# Patient Record
Sex: Female | Born: 2000 | Race: White | Hispanic: No | Marital: Single | Smoking: Never smoker
Health system: Southern US, Community
[De-identification: ages and names within clinical notes are randomized; demographics above are authoritative.]

## PROBLEM LIST (undated history)

## (undated) DIAGNOSIS — J210 Acute bronchiolitis due to respiratory syncytial virus: Secondary | ICD-10-CM

## (undated) DIAGNOSIS — R51 Headache: Secondary | ICD-10-CM

## (undated) DIAGNOSIS — G43909 Migraine, unspecified, not intractable, without status migrainosus: Secondary | ICD-10-CM

## (undated) DIAGNOSIS — L709 Acne, unspecified: Secondary | ICD-10-CM

## (undated) HISTORY — DX: Headache: R51

## (undated) HISTORY — DX: Acute bronchiolitis due to respiratory syncytial virus: J21.0

## (undated) HISTORY — DX: Migraine, unspecified, not intractable, without status migrainosus: G43.909

## (undated) HISTORY — DX: Acne, unspecified: L70.9

---
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
## 2019-02-18 ENCOUNTER — Emergency Department (HOSPITAL_COMMUNITY): Payer: Medicaid Other

## 2019-02-18 ENCOUNTER — Emergency Department (HOSPITAL_COMMUNITY)
Admission: EM | Admit: 2019-02-18 | Discharge: 2019-02-18 | Disposition: A | Discharge status: Home / Self Care | Payer: Medicaid Other | Attending: Emergency Medicine | Admitting: Emergency Medicine

## 2019-02-18 ENCOUNTER — Encounter (HOSPITAL_COMMUNITY): Payer: Self-pay | Admitting: Emergency Medicine

## 2019-02-18 ENCOUNTER — Other Ambulatory Visit: Payer: Self-pay

## 2019-02-18 DIAGNOSIS — R519 Headache, unspecified: Secondary | ICD-10-CM

## 2019-02-18 MED ORDER — IBUPROFEN 200 MG PO TABS
400.0000 mg | ORAL_TABLET | Freq: Once | ORAL | Status: AC
  Administered 2019-02-18: 400 mg via ORAL
  Filled 2019-02-18: qty 2

## 2019-02-18 NOTE — ED Provider Notes (Signed)
[facility] DEPT Provider Note   CSN: [id number] Arrival date & time: 02/18/19  1847     History   Chief Complaint Chief Complaint  Patient presents with  . Headache    HPI Paige Schmidt is a 18 y.o. female.     HPI Patient presents with headache.  Has had around 3 weeks of headaches.  Headaches are dull and sometimes in the front sometimes in the back of her head.  No vision changes.  No nausea or vomiting.  Come and go.  Used to be daily but now about twice a week.  At times will feel sharp pain in the right side of her head.  Not associated with her other headache.  States sometimes her teeth will chatter.  States her right side also feels stiffer than the left side.  States this has been more constant and is not associate with a headache.  No trauma.  States she saw her PCP who is ordered an MRI of his tonsils to be done for a few days she is worried due to the severity of the headache. Past Medical History:  Diagnosis Date  . Acne   . Headache(784.0)    migraines  . Migraines 03/12/2011  . Prematurity    33-34 weeks, 2 weeks NICU, V ent  . Prematurity of fetus   . RSV (acute bronchiolitis due to respiratory syncytial virus)     Patient Active Problem List   Diagnosis Date Noted  . Fever, unspecified 12/20/2015  . High risk sexual behavior 12/20/2015  . GAD (generalized anxiety disorder) 10/25/2014  . Plantar wart of left foot 11/06/2013  . Viral syndrome 05/26/2013  . Acne 03/12/2011    History reviewed. No pertinent surgical history.   OB History   No obstetric history on file.      Home Medications    Prior to Admission medications   Medication Sig Start Date End Date Taking? Authorizing Provider  Ascorbic Acid (VITAMIN C) 250 MG CHEW Chew 1 tablet by mouth daily.   Yes [provider]  busPIRone (BUSPAR) 15 MG tablet Take 15-20 mg by mouth See admin instructions. 15 Mg in AM and 20 MG at night 09/26/18  Yes  [provider]  etonogestrel (NEXPLANON) 68 MG IMPL implant 1 each by Subdermal route once.   Yes [provider]  Multiple Vitamin (MULTIVITAMIN WITH MINERALS) TABS tablet Take 1 tablet by mouth daily.   Yes [provider]  escitalopram (LEXAPRO) 10 MG tablet Take 1.5 tablets (15 mg total) by mouth daily. Patient not taking: Reported on 02/18/2019 08/03/15   Jonathon Resides T, FNP  escitalopram (LEXAPRO) 10 MG tablet Take 1.5 tablets (15 mg total) by mouth daily. Patient not taking: Reported on 02/18/2019 09/28/15   Trude Mcburney, FNP    Family History Family History  Problem Relation Age of Onset  . Mental illness Mother   . Mental illness Maternal Aunt   . Hypertension Maternal Grandmother   . Depression Maternal Grandmother   . Diabetes Maternal Grandmother   . Hypertension Maternal Grandfather   . Asthma Maternal Grandfather   . Stroke Maternal Grandfather     Social History Social History   Tobacco Use  . Smoking status: Never Smoker  . Smokeless tobacco: Never Used  Substance Use Topics  . Alcohol use: No    Alcohol/week: 0.0 standard drinks  . Drug use: No     Allergies   Patient has no known allergies.  Review of Systems Review of Systems  Constitutional: Negative for appetite change.  HENT: Negative for congestion.   Respiratory: Negative for shortness of breath.   Cardiovascular: Negative for chest pain.  Gastrointestinal: Negative for abdominal distention.  Genitourinary: Negative for dysuria.  Musculoskeletal: Negative for back pain.  Skin: Negative for rash.  Neurological: Positive for headaches. Negative for speech difficulty and weakness.  Hematological: Negative for adenopathy.  Psychiatric/Behavioral: Negative for confusion.     Physical Exam Updated Vital Signs BP (!) 113/93   Pulse 83   Temp 99.8 F (37.7 C)   Resp 18   LMP 02/18/2019   SpO2 100%   Physical Exam Vitals signs and nursing note  reviewed.  HENT:     Head: Normocephalic and atraumatic.  Eyes:     Extraocular Movements: Extraocular movements intact.  Cardiovascular:     Rate and Rhythm: Normal rate and regular rhythm.  Pulmonary:     Breath sounds: Normal breath sounds.  Abdominal:     General: Bowel sounds are normal.     Palpations: Abdomen is soft.  Musculoskeletal: Normal range of motion.  Skin:    General: Skin is warm.  Neurological:     Mental Status: She is alert.  Psychiatric:        Mood and Affect: Mood normal.   Good strength and movement bilaterally.   ED Treatments / Results  Labs (all labs ordered are listed, but only abnormal results are displayed) Labs Reviewed - No data to display  EKG None  Radiology Ct Head Wo Contrast  Result Date: 02/18/2019 CLINICAL DATA:  Acute headaches for the past few months EXAM: CT HEAD WITHOUT CONTRAST TECHNIQUE: Contiguous axial images were obtained from the base of the skull through the vertex without intravenous contrast. COMPARISON:  None. FINDINGS: Brain: No evidence of acute infarction, hemorrhage, hydrocephalus, extra-axial collection or mass lesion/mass effect. Vascular: No hyperdense vessel or unexpected calcification. Skull: Normal. Negative for fracture or focal lesion. Sinuses/Orbits: No acute finding. Other: None. IMPRESSION: Normal head CT Electronically Signed   By: Alcide Clever M.D.   On: 02/18/2019 22:19    Procedures Procedures (including critical care time)  Medications Ordered in ED Medications  ibuprofen (ADVIL) tablet 400 mg (has no administration in time range)     Initial Impression / Assessment and Plan / ED Course  I have reviewed the triage vital signs and the nursing notes.  Pertinent labs & imaging results that were available during my care of the patient were reviewed by me and considered in my medical decision making (see chart for details).        Patient with headache.  Nonfocal exam despite complaints of  stiffness on the right.  Head CT done reassuring.  Outpatient follow-up for further evaluation.  Final Clinical Impressions(s) / ED Diagnoses   Final diagnoses:  Acute nonintractable headache, unspecified headache type    ED Discharge Orders    None       Benjiman Core, MD 02/18/19 2302

## 2019-02-18 NOTE — ED Triage Notes (Signed)
Patient requesting CT head. States seen by PCP and one scheduled for Tuesday but started "feeling weird" today. Reports intermittent headache and numbness to right face and arm x3 weeks. Ambulatory.

## 2019-03-02 ENCOUNTER — Other Ambulatory Visit: Payer: Self-pay

## 2019-03-02 DIAGNOSIS — Z20822 Contact with and (suspected) exposure to covid-19: Secondary | ICD-10-CM

## 2019-03-04 LAB — NOVEL CORONAVIRUS, NAA: SARS-CoV-2, NAA: NOT DETECTED

## 2019-03-05 ENCOUNTER — Telehealth: Payer: Self-pay

## 2019-03-05 NOTE — Telephone Encounter (Signed)
Caller given negative result  

## 2019-03-16 ENCOUNTER — Other Ambulatory Visit: Payer: BLUE CROSS/BLUE SHIELD

## 2020-01-11 ENCOUNTER — Telehealth (INDEPENDENT_AMBULATORY_CARE_PROVIDER_SITE_OTHER): Payer: Medicaid Other | Admitting: Psychiatry

## 2020-01-11 ENCOUNTER — Other Ambulatory Visit: Payer: Self-pay

## 2020-01-11 ENCOUNTER — Encounter (HOSPITAL_COMMUNITY): Payer: Self-pay | Admitting: Psychiatry

## 2020-01-11 DIAGNOSIS — F410 Panic disorder [episodic paroxysmal anxiety] without agoraphobia: Secondary | ICD-10-CM | POA: Diagnosis not present

## 2020-01-11 DIAGNOSIS — F411 Generalized anxiety disorder: Secondary | ICD-10-CM

## 2020-01-11 DIAGNOSIS — F3341 Major depressive disorder, recurrent, in partial remission: Secondary | ICD-10-CM

## 2020-01-11 DIAGNOSIS — F3340 Major depressive disorder, recurrent, in remission, unspecified: Secondary | ICD-10-CM | POA: Insufficient documentation

## 2020-01-11 MED ORDER — BUSPIRONE HCL 10 MG PO TABS: ORAL_TABLET | ORAL | 1 refills | Status: DC

## 2020-01-11 MED ORDER — LORAZEPAM 0.5 MG PO TABS: 0.5000 mg | ORAL_TABLET | Freq: Every day | ORAL | 1 refills | Status: DC | PRN

## 2020-01-11 MED ORDER — SERTRALINE HCL 50 MG PO TABS: ORAL_TABLET | ORAL | 1 refills | Status: DC

## 2020-01-11 NOTE — Progress Notes (Signed)
Psychiatric Initial Adult Assessment   Virtual Visit via Video Note  I connected with Paige Schmidt on 01/11/20 at  9:20 AM EDT by a video enabled telemedicine application and verified that I am speaking with the correct person using two identifiers.  Location: Patient: Home Provider: Clinic   I discussed the limitations of evaluation and management by telemedicine and the availability of in person appointments. The patient expressed understanding and agreed to proceed.  I provided 35 minutes of non-face-to-face time during this encounter.     Patient Identification: Paige Schmidt MRN:  [id number] Date of Evaluation:  01/11/2020   Referral Source: [facility]  Chief Complaint:   " I want to switch my psychiatrist as I have a hard time getting prescription refills from them."  Visit Diagnosis:    ICD-10-CM   1. 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 MDD (major depressive disorder), recurrent, in partial remission (HCC)  F33.41     History of Present Illness: This is a 19 year old female with history of MDD and generalized anxiety disorder with panic attacks now seen for evaluation and establishing care. Patient reported that she has been seeing a psychiatrist in [place] however is unable to get hold of them whenever she needs refills or has any issues. She informed that she has been dealing with some depression anxiety symptoms for the past few years. She was prescribed Prozac couple of years ago but she did not tolerate it well. She was also prescribed hydroxyzine but she felt it made her feel weird and she did not like to take it. She was then started on Lexapro and has been on it for about a year. She is taking 10 mg however she did not tolerate it so she dropped the dose back to 5 mg and has been taking that regularly. She also takes buspirone 15 mg in the morning and 20 mg in the evening and wants to keep it that way. She informed  that she was prescribed propanolol as needed for anxiety but is not sure if that really helped. She mentioned that she was once prescribed one-time dose of Ativan and that helped her significantly.  Regarding her depression symptoms, she reported that she does not have any anhedonia or depressed mood anymore. She reported optimal energy levels. She does worry a lot about trivial issues. She stated that she over thinks about minor issues and has a hard time relaxing. She informed that she has a panic attack maybe about once a week now. In the past that used to be a few times a week. She denied any hallucinations. She denied any symptoms suggestive of psychosis or mania. She denied any symptoms suggestive of PTSD.  She denies any consumption of alcohol and also denied any illicit use of drugs.  She works as a Geophysicist/field seismologist and also studies Therapist, sports at [employer]. She resides with her parents. Her family history significant for anxiety and depression in both parents who are taking medications.  Past Psychiatric History: MDD, Anxiety, Panic attacks  Previous Psychotropic Medications: Yes  -Prozac, Hydroxyzine  Substance Abuse History in the last 12 months:  No.  Consequences of Substance Abuse: NA  Past Medical History:  Past Medical History:  Diagnosis Date  . Acne   . Headache(784.0)    migraines  . Migraines 03/12/2011  . Prematurity    33-34 weeks, 2 weeks NICU, V ent  . Prematurity of fetus   .  RSV (acute bronchiolitis due to respiratory syncytial virus)    No past surgical history on file.  Family Psychiatric History: Mom- depression, anxiety- takes celexa. Father- - anxiety, depression, takes Lexapro  Family History:  Family History  Problem Relation Age of Onset  . Mental illness Mother   . Mental illness Maternal Aunt   . Hypertension Maternal Grandmother   . Depression Maternal Grandmother   . Diabetes Maternal Grandmother   . Hypertension Maternal Grandfather   . Asthma  Maternal Grandfather   . Stroke Maternal Grandfather     Social History:   Social History   Socioeconomic History  . Marital status: Single    Spouse name: Not on file  . Number of children: Not on file  . Years of education: Not on file  . Highest education level: Not on file  Occupational History  . Not on file  Tobacco Use  . Smoking status: Never Smoker  . Smokeless tobacco: Never Used  Substance and Sexual Activity  . Alcohol use: No    Alcohol/week: 0.0 standard drinks  . Drug use: No  . Sexual activity: Never  Other Topics Concern  . Not on file  Social History Narrative  . Not on file   Social Determinants of Health   Financial Resource Strain:   . Difficulty of Paying Living Expenses: Not on file  Food Insecurity:   . Worried About Programme researcher, broadcasting/film/video in the Last Year: Not on file  . Ran Out of Food in the Last Year: Not on file  Transportation Needs:   . Lack of Transportation (Medical): Not on file  . Lack of Transportation (Non-Medical): Not on file  Physical Activity:   . Days of Exercise per Week: Not on file  . Minutes of Exercise per Session: Not on file  Stress:   . Feeling of Stress : Not on file  Social Connections:   . Frequency of Communication with Friends and Family: Not on file  . Frequency of Social Gatherings with Friends and Family: Not on file  . Attends Religious Services: Not on file  . Active Member of Clubs or Organizations: Not on file  . Attends Banker Meetings: Not on file  . 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 Also studying at [employer] via online classes- working on [employer] in [employer]  Allergies:  No Known Allergies  Metabolic Disorder Labs: No results found for: HGBA1C, MPG No results found for: PROLACTIN No results found for: CHOL, TRIG, HDL, CHOLHDL, VLDL, LDLCALC Lab Results  Component Value Date   TSH 2.135 12/08/2014     Therapeutic Level Labs: No results found for: LITHIUM No results found for: CBMZ No results found for: VALPROATE  Current Medications: Current Outpatient Medications  Medication Sig Dispense Refill  . Ascorbic Acid (VITAMIN C) 250 MG CHEW Chew 1 tablet by mouth daily.    . busPIRone (BUSPAR) 15 MG tablet Take 15-20 mg by mouth See admin instructions. 15 Mg in AM and 20 MG at night    . escitalopram (LEXAPRO) 10 MG tablet Take 1.5 tablets (15 mg total) by mouth daily. (Patient not taking: Reported on 02/18/2019) 45 tablet 1  . escitalopram (LEXAPRO) 10 MG tablet Take 1.5 tablets (15 mg total) by mouth daily. (Patient not taking: Reported on 02/18/2019) 45 tablet 3  . etonogestrel (NEXPLANON) 68 MG IMPL implant 1 each by Subdermal route  once.    . Multiple Vitamin (MULTIVITAMIN WITH MINERALS) TABS tablet Take 1 tablet by mouth daily.     No current facility-administered medications for this visit.     Psychiatric Specialty Exam: Review of Systems  There were no vitals taken for this visit.There is no height or weight on file to calculate BMI.  General Appearance: Fairly Groomed  Eye Contact:  Good  Speech:  Clear and Coherent and Normal Rate  Volume:  Normal  Mood:  Anxious  Affect:  Congruent  Thought Process:  Goal Directed  Orientation:  Full (Time, Place, and Person)  Thought Content:  Logical  Suicidal Thoughts:  No  Homicidal Thoughts:  No  Memory:  Immediate;   Good Recent;   Good  Judgement:  Fair  Insight:  Fair  Psychomotor Activity:  Normal  Concentration:  Concentration: Good and Attention Span: Good  Recall:  Good  Fund of Knowledge:Good  Language: Good  Akathisia:  No  Handed:  Right  AIMS (if indicated):  Not done  Assets:  Communication Skills Desire for Improvement Financial Resources/Insurance Housing  ADL's:  Intact  Cognition: WNL  Sleep:  Fair     Assessment and Plan: Patient is reporting suboptimal management of anxiety and panic  attacks with low-dose Lexapro. She does not want to go up on the dose of Lexapro as she did not tolerate it well in the past. She was prescribed as needed Ativan for breakthrough panic attacks. She wants to continue the buspirone at the current dose. Patient was offered sertraline to help with her depression anxiety symptoms. Patient was agreeable to try it. Potential side effects of medication and risks vs benefits of treatment vs non-treatment were explained and discussed. All questions were answered.   1. MDD (major depressive disorder), recurrent, in partial remission (HCC)  -Start sertraline (ZOLOFT) 50 MG tablet; Take half tablet in the morning with breakfast for 1 week then take whole tablet in the morning with breakfast  Dispense: 30 tablet; Refill: 1 -Discontinue Lexapro.  2. Generalized anxiety disorder with panic attacks  - sertraline (ZOLOFT) 50 MG tablet; Take half tablet in the morning with breakfast for 1 week then take whole tablet in the morning with breakfast  Dispense: 30 tablet; Refill: 1 -Continue busPIRone (BUSPAR) 10 MG tablet; Take one and a half tabs in the morning and 2 tabs in the evening  Dispense: 120 tablet; Refill: 1 -Start LORazepam (ATIVAN) 0.5 MG tablet; Take 1 tablet (0.5 mg total) by mouth daily as needed for anxiety.  Dispense: 15 tablet; Refill: 1  Follow-up in 6 weeks.  Zena Amos, MD 10/18/20219:42 AM

## 2020-02-02 ENCOUNTER — Other Ambulatory Visit (HOSPITAL_COMMUNITY): Payer: Self-pay | Admitting: Psychiatry

## 2020-02-02 DIAGNOSIS — F3341 Major depressive disorder, recurrent, in partial remission: Secondary | ICD-10-CM

## 2020-02-02 DIAGNOSIS — F410 Panic disorder [episodic paroxysmal anxiety] without agoraphobia: Secondary | ICD-10-CM

## 2020-02-26 ENCOUNTER — Other Ambulatory Visit: Payer: Self-pay

## 2020-02-26 ENCOUNTER — Encounter (HOSPITAL_COMMUNITY): Payer: Self-pay | Admitting: Psychiatry

## 2020-02-26 ENCOUNTER — Telehealth (INDEPENDENT_AMBULATORY_CARE_PROVIDER_SITE_OTHER): Payer: Medicaid Other | Admitting: Psychiatry

## 2020-02-26 DIAGNOSIS — F3341 Major depressive disorder, recurrent, in partial remission: Secondary | ICD-10-CM

## 2020-02-26 DIAGNOSIS — F411 Generalized anxiety disorder: Secondary | ICD-10-CM

## 2020-02-26 DIAGNOSIS — F3340 Major depressive disorder, recurrent, in remission, unspecified: Secondary | ICD-10-CM | POA: Diagnosis not present

## 2020-02-26 DIAGNOSIS — F410 Panic disorder [episodic paroxysmal anxiety] without agoraphobia: Secondary | ICD-10-CM | POA: Diagnosis not present

## 2020-02-26 MED ORDER — SERTRALINE HCL 50 MG PO TABS: 50.0000 mg | ORAL_TABLET | Freq: Every day | ORAL | 1 refills | Status: DC

## 2020-02-26 MED ORDER — BUSPIRONE HCL 10 MG PO TABS: ORAL_TABLET | ORAL | 1 refills | Status: DC

## 2020-02-26 NOTE — Progress Notes (Signed)
BH OP Progress Note   Virtual Visit via Telephone Note  I connected with Paige Schmidt on 02/26/20 at 11:20 AM EST by telephone and verified that I am speaking with the correct person using two identifiers.  Location: Patient: home Provider: Clinic   I discussed the limitations, risks, security and privacy concerns of performing an evaluation and management service by telephone and the availability of in person appointments. I also discussed with the patient that there may be a patient responsible charge related to this service. The patient expressed understanding and agreed to proceed.   I provided 14 minutes of non-face-to-face time during this encounter.    Patient Identification: Paige Schmidt MRN:  [id number] Date of Evaluation:  02/26/2020   Chief Complaint: " I am feeling good."    Visit Diagnosis:    ICD-10-CM   1. MDD (recurrent major depressive disorder) in remission (HCC)  F33.40   2. Generalized anxiety disorder with panic attacks  F41.1    F41.0     History of Present Illness: Patient reported that she feels Zoloft has helped her and she feels like she is back on the track.  She stated that her mood and anxiety are well controlled.  She feels she is doing well on the current regimen and would like to keep things the way they are.  She reported that she has not needed to take Ativan even once since she started the Zoloft.   She denies any other concerns or issues at this time.   Past Psychiatric History: MDD, Anxiety, Panic attacks  Previous Psychotropic Medications: Yes  -Prozac, Lexapro, Hydroxyzine  Substance Abuse History in the last 12 months:  No.  Consequences of Substance Abuse: NA  Past Medical History:  Past Medical History:  Diagnosis Date  . Acne   . Headache(784.0)    migraines  . Migraines 03/12/2011  . Prematurity    33-34 weeks, 2 weeks NICU, V ent  . Prematurity of fetus   . RSV (acute bronchiolitis due to respiratory syncytial  virus)    No past surgical history on file.  Family Psychiatric History: Mom- depression, anxiety- takes celexa. Father- - anxiety, depression, takes Lexapro  Family History:  Family History  Problem Relation Age of Onset  . Mental illness Mother   . Mental illness Maternal Aunt   . Hypertension Maternal Grandmother   . Depression Maternal Grandmother   . Diabetes Maternal Grandmother   . Hypertension Maternal Grandfather   . Asthma Maternal Grandfather   . Stroke Maternal Grandfather     Social History:   Social History   Socioeconomic History  . Marital status: Single    Spouse name: Not on file  . Number of children: Not on file  . Years of education: Not on file  . Highest education level: Not on file  Occupational History  . Not on file  Tobacco Use  . Smoking status: Never Smoker  . Smokeless tobacco: Never Used  Substance and Sexual Activity  . Alcohol use: No    Alcohol/week: 0.0 standard drinks  . Drug use: No  . Sexual activity: Never  Other Topics Concern  . Not on file  Social History Narrative  . Not on file   Social Determinants of Health   Financial Resource Strain:   . Difficulty of Paying Living Expenses: Not on file  Food Insecurity:   . Worried About Programme researcher, broadcasting/film/video in the Last Year: Not on file  . Ran Out  of Food in the Last Year: Not on file  Transportation Needs:   . Lack of Transportation (Medical): Not on file  . Lack of Transportation (Non-Medical): Not on file  Physical Activity:   . Days of Exercise per Week: Not on file  . Minutes of Exercise per Session: Not on file  Stress:   . Feeling of Stress : Not on file  Social Connections:   . Frequency of Communication with Friends and Family: Not on file  . Frequency of Social Gatherings with Friends and Family: Not on file  . Attends Religious Services: Not on file  . Active Member of Clubs or Organizations: Not on file  . Attends Banker Meetings: Not on file  .  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 Also studying at [employer] via online classes- working on [employer] in [employer]  Allergies:  No Known Allergies  Metabolic Disorder Labs: No results found for: HGBA1C, MPG No results found for: PROLACTIN No results found for: CHOL, TRIG, HDL, CHOLHDL, VLDL, LDLCALC Lab Results  Component Value Date   TSH 2.135 12/08/2014    Therapeutic Level Labs: No results found for: LITHIUM No results found for: CBMZ No results found for: VALPROATE  Current Medications: Current Outpatient Medications  Medication Sig Dispense Refill  . Ascorbic Acid (VITAMIN C) 250 MG CHEW Chew 1 tablet by mouth daily.    . busPIRone (BUSPAR) 10 MG tablet Take one and a half tabs in the morning and 2 tabs in the evening 120 tablet 1  . etonogestrel (NEXPLANON) 68 MG IMPL implant 1 each by Subdermal route once.    Marland Kitchen LORazepam (ATIVAN) 0.5 MG tablet Take 1 tablet (0.5 mg total) by mouth daily as needed for anxiety. 15 tablet 1  . Multiple Vitamin (MULTIVITAMIN WITH MINERALS) TABS tablet Take 1 tablet by mouth daily.    . sertraline (ZOLOFT) 50 MG tablet Take half tablet in the morning with breakfast for 1 week then take whole tablet in the morning with breakfast 90 tablet 1   No current facility-administered medications for this visit.     Psychiatric Specialty Exam: Review of Systems  There were no vitals taken for this visit.There is no height or weight on file to calculate BMI.  General Appearance: unable to assess due to phone visit  Eye Contact:  unable to assess due to phone visit  Speech:  Clear and Coherent and Normal Rate  Volume:  Normal  Mood:  Euthymic  Affect:  Congruent  Thought Process:  Goal Directed  Orientation:  Full (Time, Place, and Person)  Thought Content:  Logical  Suicidal Thoughts:  No  Homicidal Thoughts:  No  Memory:  Immediate;   Good Recent;   Good   Judgement:  Fair  Insight:  Fair  Psychomotor Activity:  Normal  Concentration:  Concentration: Good and Attention Span: Good  Recall:  Good  Fund of Knowledge:Good  Language: Good  Akathisia:  No  Handed:  Right  AIMS (if indicated):  Not done  Assets:  Communication Skills Desire for Improvement Financial Resources/Insurance Housing  ADL's:  Intact  Cognition: WNL  Sleep:  Good     Assessment and Plan: Patient seems to be doing well on current regimen.  We will continue the same regimen for now.  1. MDD (recurrent major depressive disorder) in remission (HCC)  - busPIRone (BUSPAR) 10 MG tablet; Take one  and a half tabs in the morning and 2 tabs in the evening  Dispense: 120 tablet; Refill: 1 - sertraline (ZOLOFT) 50 MG tablet; Take 1 tablet (50 mg total) by mouth daily.  Dispense: 90 tablet; Refill: 1  2. Generalized anxiety disorder with panic attacks  - busPIRone (BUSPAR) 10 MG tablet; Take one and a half tabs in the morning and 2 tabs in the evening  Dispense: 120 tablet; Refill: 1 - sertraline (ZOLOFT) 50 MG tablet; Take 1 tablet (50 mg total) by mouth daily.  Dispense: 90 tablet; Refill: 1  Follow-up in 3 months.  Zena Amos, MD 12/3/202111:36 AM

## 2020-05-20 ENCOUNTER — Encounter (HOSPITAL_COMMUNITY): Payer: Self-pay | Admitting: Psychiatry

## 2020-05-20 ENCOUNTER — Other Ambulatory Visit: Payer: Self-pay

## 2020-05-20 ENCOUNTER — Telehealth (INDEPENDENT_AMBULATORY_CARE_PROVIDER_SITE_OTHER): Payer: Medicaid Other | Admitting: Psychiatry

## 2020-05-20 DIAGNOSIS — F410 Panic disorder [episodic paroxysmal anxiety] without agoraphobia: Secondary | ICD-10-CM

## 2020-05-20 DIAGNOSIS — F3340 Major depressive disorder, recurrent, in remission, unspecified: Secondary | ICD-10-CM | POA: Diagnosis not present

## 2020-05-20 DIAGNOSIS — F411 Generalized anxiety disorder: Secondary | ICD-10-CM

## 2020-05-20 MED ORDER — LORAZEPAM 0.5 MG PO TABS: 0.5000 mg | ORAL_TABLET | Freq: Every day | ORAL | 1 refills | Status: DC | PRN

## 2020-05-20 MED ORDER — SERTRALINE HCL 50 MG PO TABS: 50.0000 mg | ORAL_TABLET | Freq: Every day | ORAL | 1 refills | Status: DC

## 2020-05-20 MED ORDER — BUSPIRONE HCL 10 MG PO TABS: ORAL_TABLET | ORAL | 2 refills | Status: DC

## 2020-05-20 NOTE — Progress Notes (Signed)
BH OP Progress Note   Virtual Visit via Video Note  I connected with Paige Schmidt on 05/20/20 at 11:20 AM EST by a video enabled telemedicine application and verified that I am speaking with the correct person using two identifiers.  Location: Patient: Car Provider: Clinic   I discussed the limitations of evaluation and management by telemedicine and the availability of in person appointments. The patient expressed understanding and agreed to proceed.  I provided 16 minutes of non-face-to-face time during this encounter.      Patient Identification: Paige Schmidt MRN:  [id number] Date of Evaluation:  05/20/2020   Chief Complaint: " Things are going well."   Visit Diagnosis:    ICD-10-CM   1. MDD (recurrent major depressive disorder) in remission (HCC)  F33.40   2. Generalized anxiety disorder with panic attacks  F41.1    F41.0     History of Present Illness: Patient follows she has been doing well.  She stated that her mood has been stable.  She is sleeping well at night.  She denied having any panic attacks recently.  She informed that she has had some anxiety which has mostly been situational. She stated that overall things are manageable and she would like to continue the same regimen.   She did ask for refills on lorazepam for as needed use.  She stated that she just wants to have refills on file just in case she needs them again. She denied any other issues or concerns today.   Past Psychiatric History: MDD, Anxiety, Panic attacks  Previous Psychotropic Medications: Yes  -Prozac, Lexapro, Hydroxyzine  Substance Abuse History in the last 12 months:  No.  Consequences of Substance Abuse: NA  Past Medical History:  Past Medical History:  Diagnosis Date  . Acne   . Headache(784.0)    migraines  . Migraines 03/12/2011  . Prematurity    33-34 weeks, 2 weeks NICU, V ent  . Prematurity of fetus   . RSV (acute bronchiolitis due to respiratory syncytial virus)     No past surgical history on file.  Family Psychiatric History: Mom- depression, anxiety- takes celexa. Father- - anxiety, depression, takes Lexapro  Family History:  Family History  Problem Relation Age of Onset  . Mental illness Mother   . Mental illness Maternal Aunt   . Hypertension Maternal Grandmother   . Depression Maternal Grandmother   . Diabetes Maternal Grandmother   . Hypertension Maternal Grandfather   . Asthma Maternal Grandfather   . Stroke Maternal Grandfather     Social History:   Social History   Socioeconomic History  . Marital status: Single    Spouse name: Not on file  . Number of children: Not on file  . Years of education: Not on file  . Highest education level: Not on file  Occupational History  . Not on file  Tobacco Use  . Smoking status: Never Smoker  . Smokeless tobacco: Never Used  Substance and Sexual Activity  . Alcohol use: No    Alcohol/week: 0.0 standard drinks  . Drug use: No  . Sexual activity: Never  Other Topics Concern  . Not on file  Social History Narrative  . 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 Also studying at [employer] via online classes- working on [employer] in [employer]  Allergies:  No Known Allergies  Metabolic Disorder Labs: No results found for: HGBA1C, MPG No results found for: PROLACTIN No results found for: CHOL, TRIG, HDL, CHOLHDL, VLDL, LDLCALC Lab Results  Component Value Date   TSH 2.135 12/08/2014    Therapeutic Level Labs: No results found for: LITHIUM No results found for: CBMZ No results found for: VALPROATE  Current Medications: Current Outpatient Medications  Medication Sig Dispense Refill  . Ascorbic  Acid (VITAMIN C) 250 MG CHEW Chew 1 tablet by mouth daily.    . busPIRone (BUSPAR) 10 MG tablet Take one and a half tabs in the morning and 2 tabs in the evening 120 tablet 1  . etonogestrel (NEXPLANON) 68 MG IMPL implant 1 each by Subdermal route once.    Marland Kitchen LORazepam (ATIVAN) 0.5 MG tablet Take 1 tablet (0.5 mg total) by mouth daily as needed for anxiety. 15 tablet 1  . Multiple Vitamin (MULTIVITAMIN WITH MINERALS) TABS tablet Take 1 tablet by mouth daily.    . sertraline (ZOLOFT) 50 MG tablet Take 1 tablet (50 mg total) by mouth daily. 90 tablet 1   No current facility-administered medications for this visit.     Psychiatric Specialty Exam: Review of Systems  There were no vitals taken for this visit.There is no height or weight on file to calculate BMI.  General Appearance: Fairly groomed  Eye Contact: good  Speech:  Clear and Coherent and Normal Rate  Volume:  Normal  Mood:  Euthymic  Affect:  Congruent  Thought Process:  Goal Directed  Orientation:  Full (Time, Place, and Person)  Thought Content:  Logical  Suicidal Thoughts:  No  Homicidal Thoughts:  No  Memory:  Immediate;   Good Recent;   Good  Judgement:  Fair  Insight:  Fair  Psychomotor Activity:  Normal  Concentration:  Concentration: Good and Attention Span: Good  Recall:  Good  Fund of Knowledge:Good  Language: Good  Akathisia:  No  Handed:  Right  AIMS (if indicated):  Not done  Assets:  Communication Skills Desire for Improvement Financial Resources/Insurance Housing  ADL's:  Intact  Cognition: WNL  Sleep:  Good     Assessment and Plan: Patient seems to be stable on her current regimen.  1. MDD (recurrent major depressive disorder) in remission (HCC)  - busPIRone (BUSPAR) 10 MG tablet; Take one and a half tabs in the morning and 2 tabs in the evening  Dispense: 120 tablet; Refill: 2 - sertraline (ZOLOFT) 50 MG tablet; Take 1 tablet (50 mg total) by mouth daily.  Dispense: 90 tablet; Refill: 1  2.  Generalized anxiety disorder with panic attacks  - LORazepam (ATIVAN) 0.5 MG tablet; Take 1 tablet (0.5 mg total) by mouth daily as needed for anxiety.  Dispense: 15 tablet; Refill: 1 - busPIRone (BUSPAR) 10 MG tablet; Take one and a half tabs in the morning and 2 tabs in the evening  Dispense: 120 tablet; Refill: 2 - sertraline (ZOLOFT) 50 MG tablet; Take 1 tablet (50 mg total) by mouth daily.  Dispense: 90 tablet; Refill: 1  Continue same medication regimen. Follow up in 3 months.    Zena Amos, MD 2/25/202211:09 AM

## 2020-07-25 ENCOUNTER — Other Ambulatory Visit (HOSPITAL_COMMUNITY): Payer: Self-pay | Admitting: Psychiatry

## 2020-07-25 DIAGNOSIS — F410 Panic disorder [episodic paroxysmal anxiety] without agoraphobia: Secondary | ICD-10-CM

## 2020-07-25 DIAGNOSIS — F3340 Major depressive disorder, recurrent, in remission, unspecified: Secondary | ICD-10-CM

## 2020-07-25 DIAGNOSIS — F411 Generalized anxiety disorder: Secondary | ICD-10-CM

## 2020-08-16 ENCOUNTER — Other Ambulatory Visit: Payer: Self-pay

## 2020-08-16 ENCOUNTER — Encounter (HOSPITAL_COMMUNITY): Payer: Self-pay | Admitting: Psychiatry

## 2020-08-16 ENCOUNTER — Telehealth (INDEPENDENT_AMBULATORY_CARE_PROVIDER_SITE_OTHER): Payer: Medicaid Other | Admitting: Psychiatry

## 2020-08-16 DIAGNOSIS — F3340 Major depressive disorder, recurrent, in remission, unspecified: Secondary | ICD-10-CM

## 2020-08-16 DIAGNOSIS — F410 Panic disorder [episodic paroxysmal anxiety] without agoraphobia: Secondary | ICD-10-CM | POA: Diagnosis not present

## 2020-08-16 DIAGNOSIS — F411 Generalized anxiety disorder: Secondary | ICD-10-CM

## 2020-08-16 MED ORDER — LORAZEPAM 0.5 MG PO TABS: 0.5000 mg | ORAL_TABLET | Freq: Every day | ORAL | 1 refills | Status: DC | PRN

## 2020-08-16 MED ORDER — BUSPIRONE HCL 10 MG PO TABS: ORAL_TABLET | ORAL | 1 refills | Status: DC

## 2020-08-16 MED ORDER — SERTRALINE HCL 50 MG PO TABS: 50.0000 mg | ORAL_TABLET | Freq: Every day | ORAL | 0 refills | Status: DC

## 2020-08-16 NOTE — Progress Notes (Signed)
BH OP Progress Note   Virtual Visit via Video Note  I connected with Paige Schmidt on 08/16/20 at 11:00 AM EDT by a video enabled telemedicine application and verified that I am speaking with the correct person using two identifiers.  Location: Patient: Home Provider: Clinic   I discussed the limitations of evaluation and management by telemedicine and the availability of in person appointments. The patient expressed understanding and agreed to proceed.  I provided 14 minutes of non-face-to-face time during this encounter.        Patient Identification: Paige Schmidt MRN:  [id number] Date of Evaluation:  08/16/2020   Chief Complaint: " I am doing good."   Visit Diagnosis:    ICD-10-CM   1. MDD (recurrent major depressive disorder) in remission (HCC)  F33.40 busPIRone (BUSPAR) 10 MG tablet    sertraline (ZOLOFT) 50 MG tablet  2. Generalized anxiety disorder with panic attacks  F41.1 busPIRone (BUSPAR) 10 MG tablet   F41.0 LORazepam (ATIVAN) 0.5 MG tablet    sertraline (ZOLOFT) 50 MG tablet    History of Present Illness: Patient reported she is doing well.  She informed that her mood has been stable.  She has had a few panic attacks here and there but they are not as frequent and as intense as they were in the past.  She stated that she has had to take Ativan maybe 3-4 times in the past month. She has been able to sleep well at night. She is still working as a Social worker in the afternoons.  She denied any other concerns or issues today. She informed that she is going to [place] for vacation in July.  Past Psychiatric History: MDD, Anxiety, Panic attacks  Previous Psychotropic Medications: Yes  -Prozac, Lexapro, Hydroxyzine  Substance Abuse History in the last 12 months:  No.  Consequences of Substance Abuse: NA  Past Medical History:  Past Medical History:  Diagnosis Date  . Acne   . Headache(784.0)    migraines  . Migraines 03/12/2011  . Prematurity    33-34  weeks, 2 weeks NICU, V ent  . Prematurity of fetus   . RSV (acute bronchiolitis due to respiratory syncytial virus)    History reviewed. No pertinent surgical history.  Family Psychiatric History: Mom- depression, anxiety- takes celexa. Father- - anxiety, depression, takes Lexapro  Family History:  Family History  Problem Relation Age of Onset  . Mental illness Mother   . Mental illness Maternal Aunt   . Hypertension Maternal Grandmother   . Depression Maternal Grandmother   . Diabetes Maternal Grandmother   . Hypertension Maternal Grandfather   . Asthma Maternal Grandfather   . Stroke Maternal Grandfather     Social History:   Social History   Socioeconomic History  . Marital status: Single    Spouse name: Not on file  . Number of children: Not on file  . Years of education: Not on file  . Highest education level: Not on file  Occupational History  . Not on file  Tobacco Use  . Smoking status: Never Smoker  . Smokeless tobacco: Never Used  Substance and Sexual Activity  . Alcohol use: No    Alcohol/week: 0.0 standard drinks  . Drug use: No  . Sexual activity: Never  Other Topics Concern  . Not on file  Social History Narrative  . 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 Also studying at [employer] via online classes- working on [employer] in [employer]  Allergies:  No Known Allergies  Metabolic Disorder Labs: No results found for: HGBA1C, MPG No results found for: PROLACTIN No results found for: CHOL, TRIG, HDL, CHOLHDL, VLDL, LDLCALC Lab Results  Component Value Date   TSH 2.135 12/08/2014    Therapeutic Level Labs: No results found for: LITHIUM No results found for: CBMZ No  results found for: VALPROATE  Current Medications: Current Outpatient Medications  Medication Sig Dispense Refill  . Ascorbic Acid (VITAMIN C) 250 MG CHEW Chew 1 tablet by mouth daily.    . busPIRone (BUSPAR) 10 MG tablet Take 1 and a half tablets in the morning and take 2 tablets in the evening 360 tablet 1  . etonogestrel (NEXPLANON) 68 MG IMPL implant 1 each by Subdermal route once.    Marland Kitchen LORazepam (ATIVAN) 0.5 MG tablet Take 1 tablet (0.5 mg total) by mouth daily as needed for anxiety. 15 tablet 1  . Multiple Vitamin (MULTIVITAMIN WITH MINERALS) TABS tablet Take 1 tablet by mouth daily.    . sertraline (ZOLOFT) 50 MG tablet Take 1 tablet (50 mg total) by mouth daily. 90 tablet 0   No current facility-administered medications for this visit.     Psychiatric Specialty Exam: Review of Systems  There were no vitals taken for this visit.There is no height or weight on file to calculate BMI.  General Appearance: Fairly groomed  Eye Contact: good  Speech:  Clear and Coherent and Normal Rate  Volume:  Normal  Mood:  Euthymic  Affect:  Congruent  Thought Process:  Goal Directed  Orientation:  Full (Time, Place, and Person)  Thought Content:  Logical  Suicidal Thoughts:  No  Homicidal Thoughts:  No  Memory:  Immediate;   Good Recent;   Good  Judgement:  Fair  Insight:  Fair  Psychomotor Activity:  Normal  Concentration:  Concentration: Good and Attention Span: Good  Recall:  Good  Fund of Knowledge:Good  Language: Good  Akathisia:  No  Handed:  Right  AIMS (if indicated):  Not done  Assets:  Communication Skills Desire for Improvement Financial Resources/Insurance Housing  ADL's:  Intact  Cognition: WNL  Sleep:  Good     Assessment and Plan: Patient appears to be doing well on her regimen.  1. MDD (recurrent major depressive disorder) in remission (HCC)  - busPIRone (BUSPAR) 10 MG tablet; Take one and a half tabs in the morning and 2 tabs in the evening  Dispense: 120  tablet; Refill: 2 - sertraline (ZOLOFT) 50 MG tablet; Take 1 tablet (50 mg total) by mouth daily.  Dispense: 90 tablet; Refill: 1  2. Generalized anxiety disorder with panic attacks  - LORazepam (ATIVAN) 0.5 MG tablet; Take 1 tablet (0.5 mg total) by mouth daily as needed for anxiety.  Dispense: 15 tablet; Refill: 1 - busPIRone (BUSPAR) 10 MG tablet; Take one and a half tabs in the morning and 2 tabs in the evening  Dispense: 120 tablet; Refill: 2 - sertraline (ZOLOFT) 50 MG tablet; Take 1 tablet (50 mg total) by mouth daily.  Dispense: 90 tablet; Refill: 1  Continue same medication regimen. Follow up in 3 months.  Patient was informed that since writer is leaving  this office she will be seen by different provider at the time of her next visit.  Zena Amos, MD 5/24/202211:13 AM

## 2020-11-10 ENCOUNTER — Telehealth (INDEPENDENT_AMBULATORY_CARE_PROVIDER_SITE_OTHER): Payer: Medicaid Other | Admitting: Physician Assistant

## 2020-11-10 DIAGNOSIS — F411 Generalized anxiety disorder: Secondary | ICD-10-CM

## 2020-11-10 DIAGNOSIS — F3340 Major depressive disorder, recurrent, in remission, unspecified: Secondary | ICD-10-CM

## 2020-11-10 DIAGNOSIS — F410 Panic disorder [episodic paroxysmal anxiety] without agoraphobia: Secondary | ICD-10-CM

## 2020-11-10 MED ORDER — BUSPIRONE HCL 10 MG PO TABS: ORAL_TABLET | ORAL | 1 refills | Status: DC

## 2020-11-10 MED ORDER — SERTRALINE HCL 50 MG PO TABS
50.0000 mg | ORAL_TABLET | Freq: Every day | ORAL | 0 refills | Status: DC
Start: 2020-11-10 — End: 2023-06-24

## 2020-11-10 MED ORDER — LORAZEPAM 0.5 MG PO TABS: 0.5000 mg | ORAL_TABLET | Freq: Every day | ORAL | 1 refills | Status: AC | PRN

## 2020-11-10 NOTE — Progress Notes (Signed)
BH MD/PA/NP OP Progress Note  Virtual Visit via Telephone Note  I connected with Paige Schmidt on 11/10/20 at 11:00 AM EDT by telephone and verified that I am speaking with the correct person using two identifiers.  Location: Patient: Home Provider: Clinic   I discussed the limitations, risks, security and privacy concerns of performing an evaluation and management service by telephone and the availability of in person appointments. I also discussed with the patient that there may be a patient responsible charge related to this service. The patient expressed understanding and agreed to proceed.  Follow Up Instructions:   I discussed the assessment and treatment plan with the patient. The patient was provided an opportunity to ask questions and all were answered. The patient agreed with the plan and demonstrated an understanding of the instructions.   The patient was advised to call back or seek an in-person evaluation if the symptoms worsen or if the condition fails to improve as anticipated.  I provided 15 minutes of non-face-to-face time during this encounter.  Meta Hatchet, PA    11/10/2020 11:13 AM Paige Schmidt  MRN:  [id number]  Chief Complaint: Follow up and medication management  HPI:     Visit Diagnosis:    ICD-10-CM   1. MDD (recurrent major depressive disorder) in remission (HCC)  F33.40 busPIRone (BUSPAR) 10 MG tablet    sertraline (ZOLOFT) 50 MG tablet    2. Generalized anxiety disorder with panic attacks  F41.1 busPIRone (BUSPAR) 10 MG tablet   F41.0 LORazepam (ATIVAN) 0.5 MG tablet    sertraline (ZOLOFT) 50 MG tablet      Past Psychiatric History:  Major depressive disorder Anxiety Panic Attacks  Past Medical History:  Past Medical History:  Diagnosis Date   Acne    Headache(784.0)    migraines   Migraines 03/12/2011   Prematurity    33-34 weeks, 2 weeks NICU, V ent   Prematurity of fetus    RSV (acute bronchiolitis due to respiratory  syncytial virus)    No past surgical history on file.  Family Psychiatric History:  Mother - Depression and anxiety. Patient's mother currently takes celexa Father- anxiety and depression. Patient's father is currently taking lexapro  Family History:  Family History  Problem Relation Age of Onset   Mental illness Mother    Mental illness Maternal Aunt    Hypertension Maternal Grandmother    Depression Maternal Grandmother    Diabetes Maternal Grandmother    Hypertension Maternal Grandfather    Asthma Maternal Grandfather    Stroke Maternal Grandfather     Social History:  Social History   Socioeconomic History   Marital status: Single    Spouse name: Not on file   Number of children: Not on file   Years of education: Not on file   Highest education level: Not on file  Occupational History   Not on file  Tobacco Use   Smoking status: Never   Smokeless tobacco: Never  Substance and Sexual Activity   Alcohol use: No    Alcohol/week: 0.0 standard drinks   Drug use: No   Sexual activity: Never  Other Topics Concern   Not on file  Social History Narrative   Not on file   Social Determinants of Health   Financial Resource Strain: Not on file  Food Insecurity: Not on file  Transportation Needs: Not on file  Physical Activity: Not on file  Stress: Not on file  Social Connections: Not on file  Allergies: No Known Allergies  Metabolic Disorder Labs: No results found for: HGBA1C, MPG No results found for: PROLACTIN No results found for: CHOL, TRIG, HDL, CHOLHDL, VLDL, LDLCALC Lab Results  Component Value Date   TSH 2.135 12/08/2014    Therapeutic Level Labs: No results found for: LITHIUM No results found for: VALPROATE No components found for:  CBMZ  Current Medications: Current Outpatient Medications  Medication Sig Dispense Refill   Ascorbic Acid (VITAMIN C) 250 MG CHEW Chew 1 tablet by mouth daily.     busPIRone (BUSPAR) 10 MG tablet Take 1 and a  half tablets in the morning and take 2 tablets in the evening 360 tablet 1   etonogestrel (NEXPLANON) 68 MG IMPL implant 1 each by Subdermal route once.     LORazepam (ATIVAN) 0.5 MG tablet Take 1 tablet (0.5 mg total) by mouth daily as needed for anxiety. 15 tablet 1   Multiple Vitamin (MULTIVITAMIN WITH MINERALS) TABS tablet Take 1 tablet by mouth daily.     sertraline (ZOLOFT) 50 MG tablet Take 1 tablet (50 mg total) by mouth daily. 90 tablet 0   No current facility-administered medications for this visit.     Musculoskeletal: Strength & Muscle Tone: Unable to assess due to telemedicine visit Gait & Station: Unable to assess due to telemedicine visit Patient leans: Unable to assess due to telemedicine visit  Psychiatric Specialty Exam: Review of Systems  There were no vitals taken for this visit.There is no height or weight on file to calculate BMI.  General Appearance: Unable to assess due to telemedicine visit  Eye Contact:  Unable to assess due to telemedicine visit  Speech:  Clear and Coherent and Normal Rate  Volume:  Normal  Mood:  Anxious and Euthymic  Affect:  Appropriate and Congruent  Thought Process:  Coherent and Descriptions of Associations: Intact  Orientation:  Full (Time, Place, and Person)  Thought Content: WDL   Suicidal Thoughts:  No  Homicidal Thoughts:  No  Memory:  Immediate;   Good Recent;   Good Remote;   Good  Judgement:  Good  Insight:  Fair  Psychomotor Activity:  Normal  Concentration:  Concentration: Good and Attention Span: Good  Recall:  Good  Fund of Knowledge: Good  Language: Good  Akathisia:  No  Handed:  Right  AIMS (if indicated): not done  Assets:  Communication Skills Desire for Improvement Financial Resources/Insurance Housing  ADL's:  Intact  Cognition: WNL  Sleep:  Good   Screenings: GAD-7    Flowsheet Row Video Visit from 11/10/2020 in [facility]  Total GAD-7 Score 5      PHQ2-9     Flowsheet Row Video Visit from 11/10/2020 in [facility]  PHQ-2 Total Score 2  PHQ-9 Total Score 5      Flowsheet Row Video Visit from 11/10/2020 in [facility]  C-SSRS RISK CATEGORY No Risk        Assessment and Plan:     1. MDD (recurrent major depressive disorder) in remission (HCC)  - busPIRone (BUSPAR) 10 MG tablet; Take 1 and a half tablets in the morning and take 2 tablets in the evening  Dispense: 360 tablet; Refill: 1 - sertraline (ZOLOFT) 50 MG tablet; Take 1 tablet (50 mg total) by mouth daily.  Dispense: 90 tablet; Refill: 0  2. Generalized anxiety disorder with panic attacks  - busPIRone (BUSPAR) 10 MG tablet; Take 1 and a half tablets in  the morning and take 2 tablets in the evening  Dispense: 360 tablet; Refill: 1 - LORazepam (ATIVAN) 0.5 MG tablet; Take 1 tablet (0.5 mg total) by mouth daily as needed for anxiety.  Dispense: 15 tablet; Refill: 1 - sertraline (ZOLOFT) 50 MG tablet; Take 1 tablet (50 mg total) by mouth daily.  Dispense: 90 tablet; Refill: 0  Patient to follow up in 2 months Provider spent a total of 15 minutes with the patient/reviewing patient's chart  Meta Hatchet, PA 12/23/2020, 10:49 PM

## 2020-12-23 ENCOUNTER — Encounter (HOSPITAL_COMMUNITY): Payer: Self-pay | Admitting: Physician Assistant

## 2021-02-07 ENCOUNTER — Telehealth (HOSPITAL_COMMUNITY): Payer: Medicaid Other | Admitting: Family

## 2021-02-07 NOTE — Progress Notes (Unsigned)
NP attempted to reach patient by [employer] and a [employer] safe voicemail was left. Patient to reschedule appointment.

## 2021-05-12 IMAGING — CT CT HEAD W/O CM
3 series · 16 of 47 positions shown, 19 images · non-contrast
Comparison: None.

CLINICAL DATA: Acute headaches for the past few months

EXAM:
CT HEAD WITHOUT CONTRAST
TECHNIQUE: Contiguous axial images were obtained from the base of the skull
through the vertex without intravenous contrast.

[Series 2: head wo · axial · 0.38mm/px · z∈[+1500,+1625]mm · 10 of 30 slices shown, 13 images]
[im 3/30  brain]
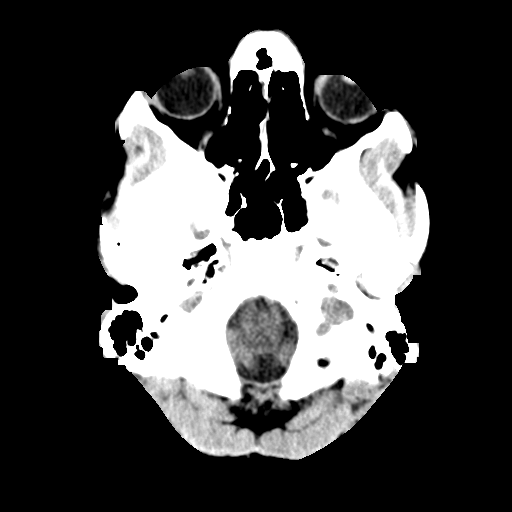
[im 3/30  bone]
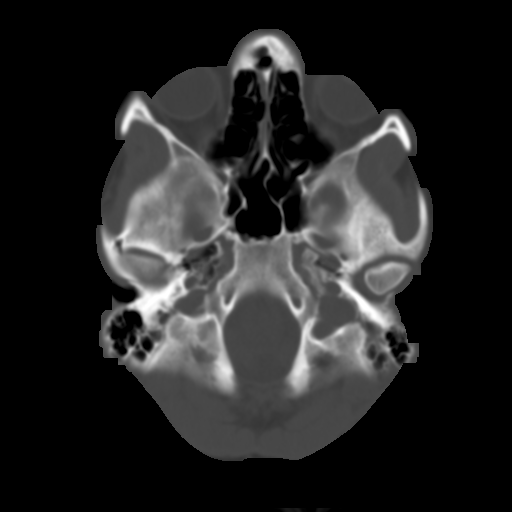
[im 6/30  brain]
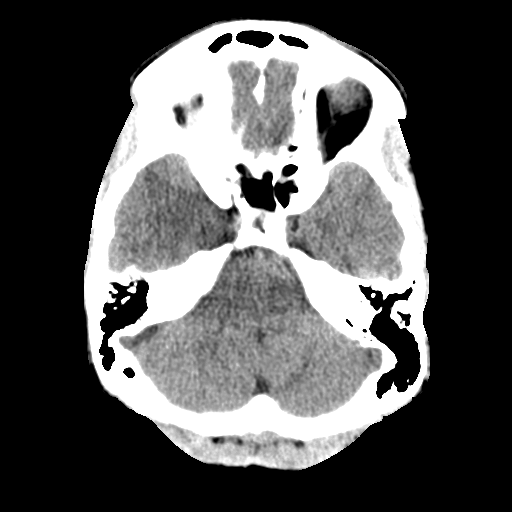
[im 9/30  brain]
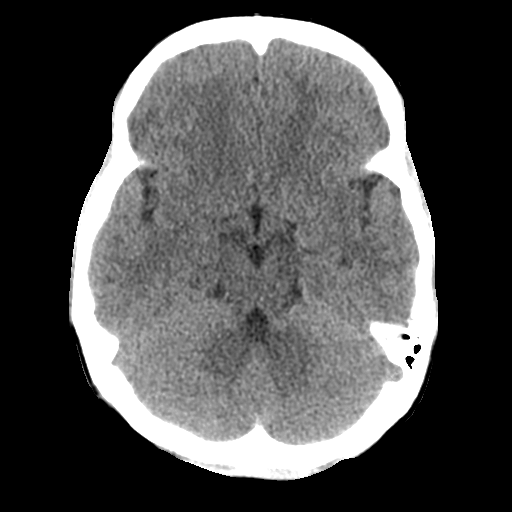
[im 11/30  brain]
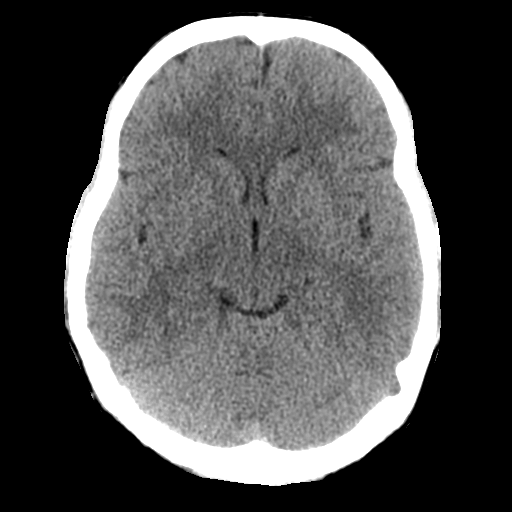
[im 14/30  brain]
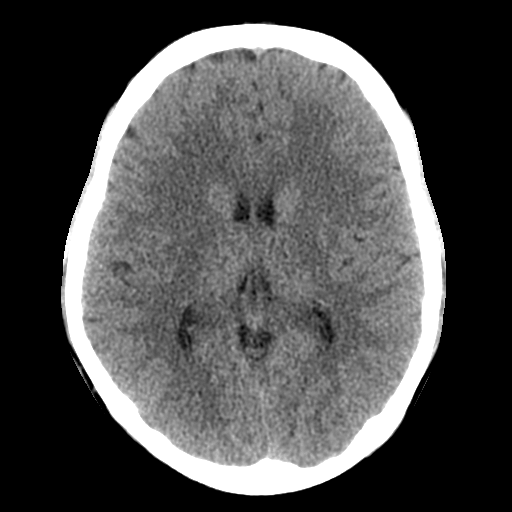
[im 14/30  bone]
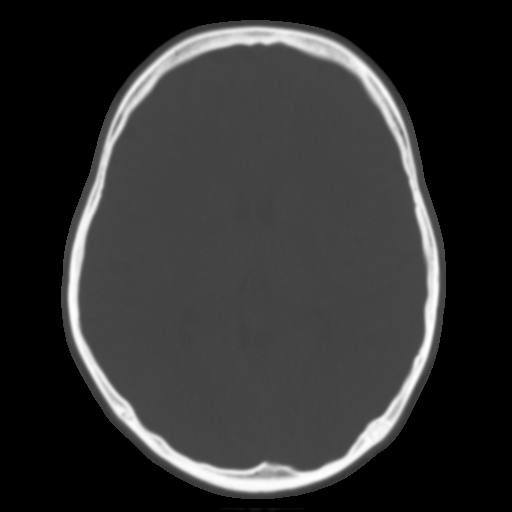
[im 17/30  brain]
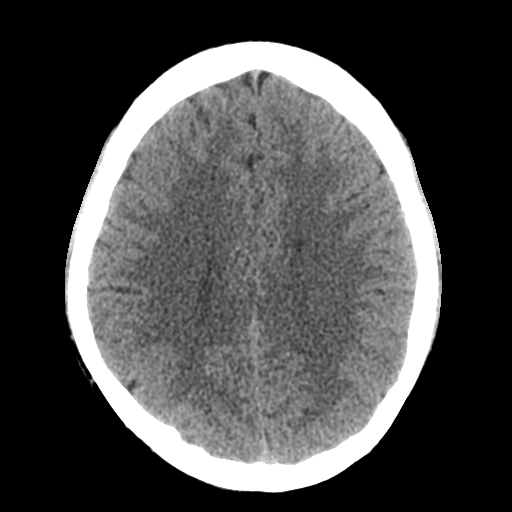
[im 20/30  brain]
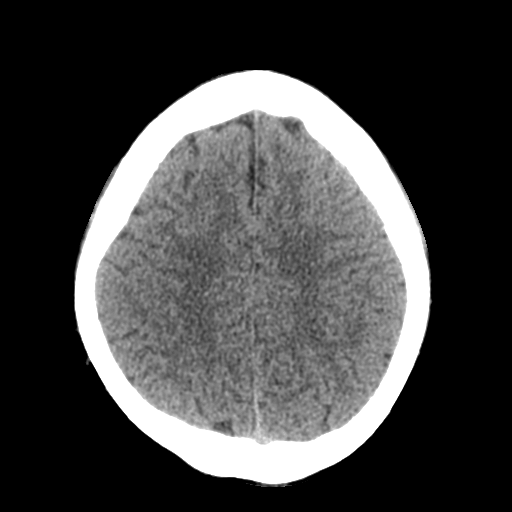
[im 23/30  brain]
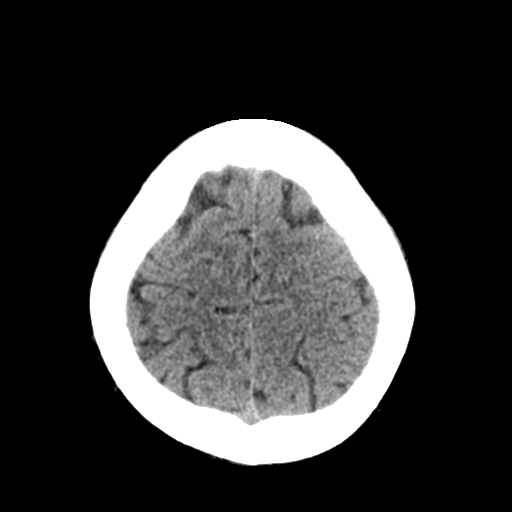
[im 25/30  brain]
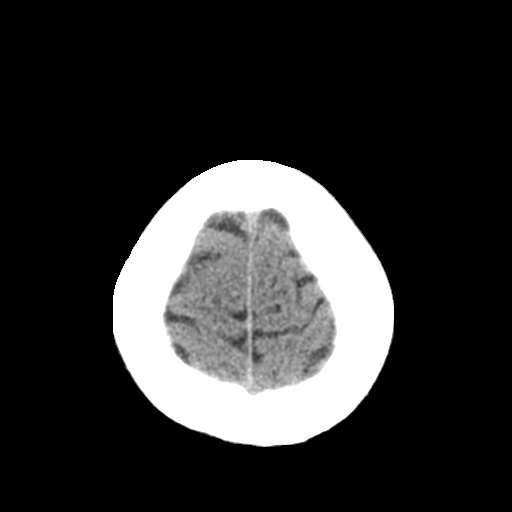
[im 25/30  bone]
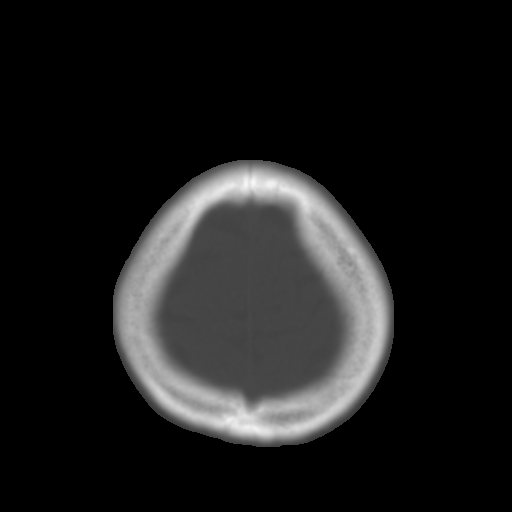
[im 28/30  brain]
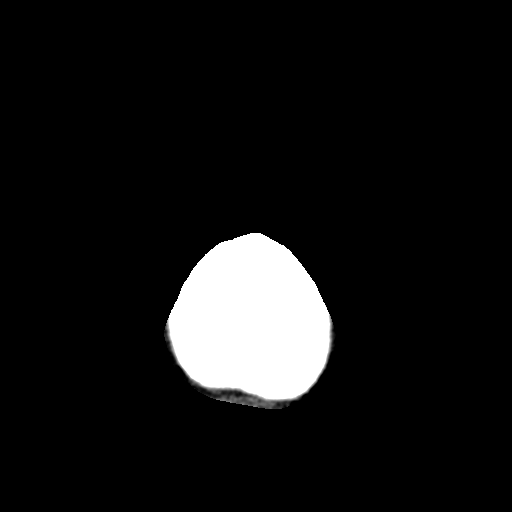

[Series 5: coronal soft tissue · coronal · 0.29mm/px · 3 of 65 slices shown]
[im 22/65  brain]
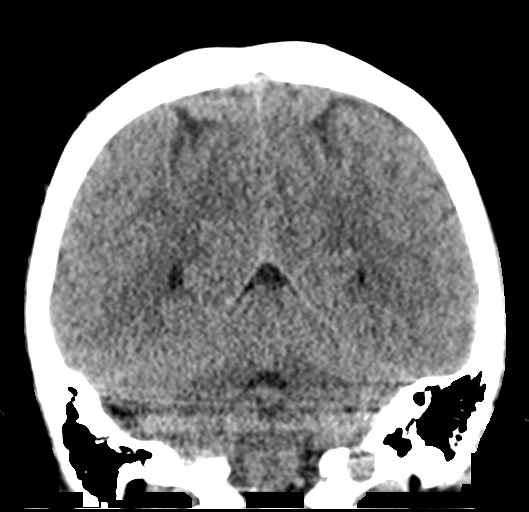
[im 29/65  brain]
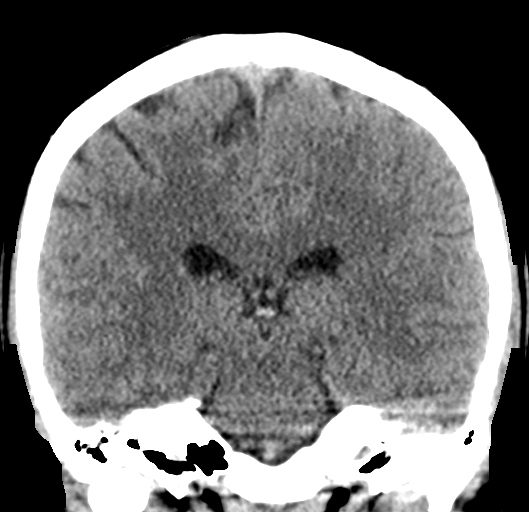
[im 36/65  brain]
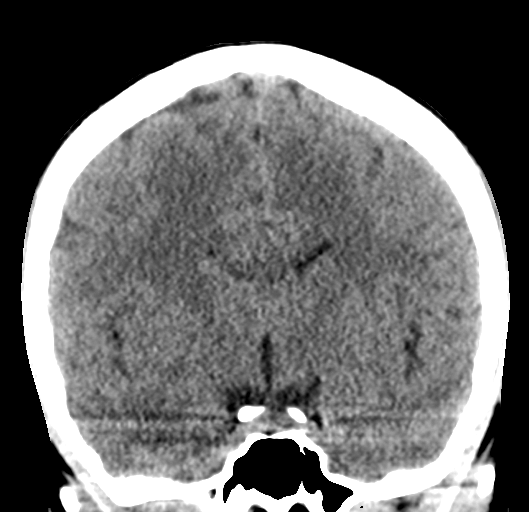

[Series 6: sagittal soft tissue · sagittal · 0.29mm/px · 3 of 52 slices shown]
[im 18/52  brain]
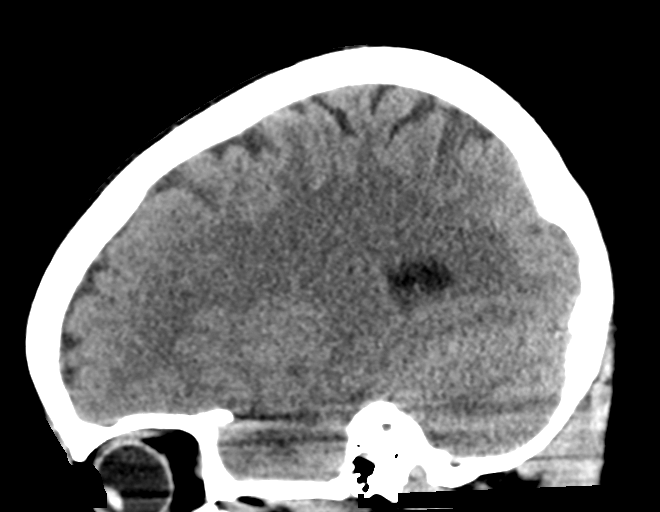
[im 26/52  brain]
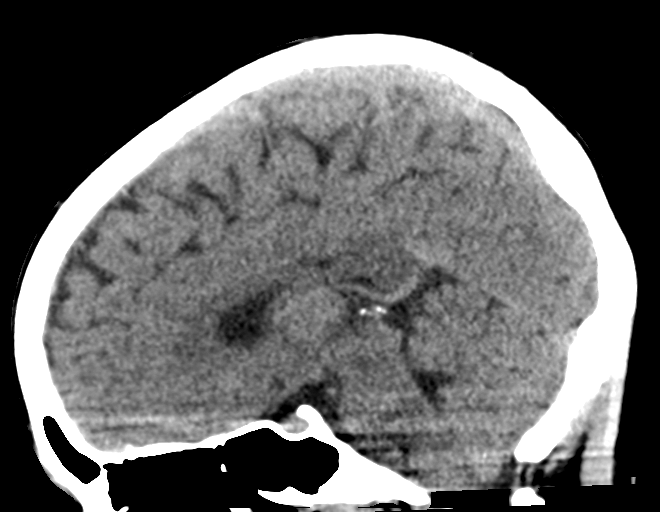
[im 35/52  brain]
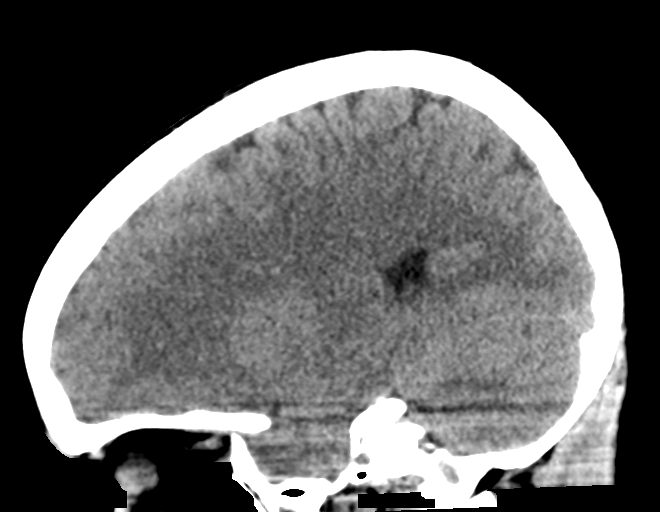

[16 of 47 positions shown; findings below may reference images not displayed]

FINDINGS: Brain: No evidence of acute infarction, hemorrhage, hydrocephalus,
extra-axial collection or mass lesion/mass effect.

Vascular: No hyperdense vessel or unexpected calcification.

Skull: Normal. Negative for fracture or focal lesion.

Sinuses/Orbits: No acute finding.

Other: None.
IMPRESSION: Normal head CT

## 2021-06-10 ENCOUNTER — Emergency Department (HOSPITAL_COMMUNITY)
Admission: EM | Admit: 2021-06-10 | Discharge: 2021-06-10 | Disposition: A | Discharge status: Home / Self Care | Payer: Medicaid Other | Attending: Emergency Medicine | Admitting: Emergency Medicine

## 2021-06-10 ENCOUNTER — Encounter (HOSPITAL_COMMUNITY): Payer: Self-pay

## 2021-06-10 DIAGNOSIS — R112 Nausea with vomiting, unspecified: Secondary | ICD-10-CM | POA: Diagnosis present

## 2021-06-10 DIAGNOSIS — F10229 Alcohol dependence with intoxication, unspecified: Secondary | ICD-10-CM | POA: Diagnosis not present

## 2021-06-10 DIAGNOSIS — F10120 Alcohol abuse with intoxication, uncomplicated: Secondary | ICD-10-CM

## 2021-06-10 MED ORDER — ONDANSETRON HCL 4 MG PO TABS: 4.0000 mg | ORAL_TABLET | Freq: Three times a day (TID) | ORAL | 0 refills | Status: DC | PRN

## 2021-06-10 MED ORDER — ONDANSETRON 4 MG PO TBDP
4.0000 mg | ORAL_TABLET | Freq: Once | ORAL | Status: AC
  Administered 2021-06-10: 4 mg via ORAL
  Filled 2021-06-10: qty 1

## 2021-06-10 NOTE — ED Notes (Signed)
Pt provided ice chips.

## 2021-06-10 NOTE — Discharge Instructions (Signed)
Avoid heavy alcohol use as it can negatively impact your health.  Return if your symptoms worsen or if you have any other concerns.  ?

## 2021-06-10 NOTE — ED Notes (Signed)
Vomiting has ceased at the moment after Zofran ?

## 2021-06-10 NOTE — ED Notes (Signed)
I provided reinforced discharge education based off of discharge instructions. Pt acknowledged and understood my education. Pt had no further questions/concerns for provider/myself.  °

## 2021-06-10 NOTE — ED Triage Notes (Signed)
Pt states that she had approx 7 drinks last night and has emesis today.  ?Pt was unable to keep down pedialyte/bread this AM, family concerned. ?

## 2021-06-10 NOTE — ED Provider Notes (Signed)
?[place] DEPT ?Provider Note ? ? ?CSN: [id number] ?Arrival date & time: 06/10/21  1346 ? ?  ? ?History ? ?Chief Complaint  ?Patient presents with  ? Emesis  ? ? ?Paige Schmidt is a 21 y.o. female. ? ?The history is provided by the patient. No language interpreter was used.  ?Emesis ? ?21 year old female who presents for evaluation of nausea and vomiting.  Patient report she had approximately 7 drinks of alcohol last night and since then she has had throbbing headache, feeling nauseous, vomited multiple times.  She did try to eat bread and Pedialyte earlier today but unable to keep it down.  She does not complain of any abdominal pain no diarrhea or constipation. ? ?Home Medications ?Prior to Admission medications   ?Medication Sig Start Date End Date Taking? Authorizing Provider  ?Ascorbic Acid (VITAMIN C) 250 MG CHEW Chew 1 tablet by mouth daily.    [provider]  ?busPIRone (BUSPAR) 10 MG tablet Take 1 and a half tablets in the morning and take 2 tablets in the evening 11/10/20   Nwoko, Isidoro Donning E, PA  ?etonogestrel (NEXPLANON) 68 MG IMPL implant 1 each by Subdermal route once.    [provider]  ?LORazepam (ATIVAN) 0.5 MG tablet Take 1 tablet (0.5 mg total) by mouth daily as needed for anxiety. 11/10/20   Malachy Mood, PA  ?Multiple Vitamin (MULTIVITAMIN WITH MINERALS) TABS tablet Take 1 tablet by mouth daily.    [provider]  ?sertraline (ZOLOFT) 50 MG tablet Take 1 tablet (50 mg total) by mouth daily. 11/10/20   Malachy Mood, PA  ?   ? ?Allergies    ?Patient has no known allergies.   ? ?Review of Systems   ?Review of Systems  ?Gastrointestinal:  Positive for vomiting.  ?All other systems reviewed and are negative. ? ?Physical Exam ?Updated Vital Signs ?BP 135/82 (BP Location: Right Arm)   Pulse 80   Temp 98.6 ?F (37 ?C) (Oral)   Resp 18   Ht 5\' 4"  (1.626 m)   Wt 49.9 kg   LMP 06/08/2021   SpO2 100%   BMI 18.88 kg/m?  ?Physical  Exam ?Vitals and nursing note reviewed.  ?Constitutional:   ?   General: She is not in acute distress. ?   Appearance: She is well-developed.  ?HENT:  ?   Head: Atraumatic.  ?Eyes:  ?   Conjunctiva/sclera: Conjunctivae normal.  ?Cardiovascular:  ?   Rate and Rhythm: Normal rate and regular rhythm.  ?   Pulses: Normal pulses.  ?   Heart sounds: Normal heart sounds.  ?Pulmonary:  ?   Effort: Pulmonary effort is normal.  ?Abdominal:  ?   General: Abdomen is flat.  ?   Palpations: Abdomen is soft.  ?   Tenderness: There is no abdominal tenderness.  ?Musculoskeletal:  ?   Cervical back: Neck supple.  ?Skin: ?   Findings: No rash.  ?Neurological:  ?   Mental Status: She is alert. Mental status is at baseline.  ?Psychiatric:     ?   Mood and Affect: Mood normal.  ? ? ?ED Results / Procedures / Treatments   ?Labs ?(all labs ordered are listed, but only abnormal results are displayed) ?Labs Reviewed - No data to display ? ?EKG ?None ? ?Radiology ?No results found. ? ?Procedures ?Procedures  ? ? ?Medications Ordered in ED ?Medications  ?ondansetron (ZOFRAN-ODT) disintegrating tablet 4 mg (4 mg Oral Given 06/10/21 1414)  ? ? ?  ED Course/ Medical Decision Making/ A&P ?  ?                        ?Medical Decision Making ?Risk ?Prescription drug management. ? ? ?BP 135/82 (BP Location: Right Arm)   Pulse 80   Temp 98.6 ?F (37 ?C) (Oral)   Resp 18   Ht 5\' 4"  (1.626 m)   Wt 49.9 kg   LMP 06/08/2021   SpO2 100%   BMI 18.88 kg/m?  ? ?2:20 PM ?This is a 21 year old female presenting complaining of nausea vomiting along with headache after drinking alcohol the night before.  Symptoms consistent with a hangover.  She has a fairly benign abdominal exam I have low suspicion for acute abdominal pathology such as appendicitis, colitis, pancreatitis, cholecystitis, or gastritis.  She does not appear dehydrated.  Vital signs stable.  She is afebrile, no hypoxia.  We will give Zofran ODT and will fluid challenge once patient feels  better. ? ?3:18 PM ?After taking Zofran, patient felt better, able to tolerate p.o.  On reexam, abdomen is soft and nontender.  Vital signs stable.  Encourage patient to avoid binge drinking.  Will discharge home with Zofran.  Return precaution given.  Patient voiced understanding and agrees with plan.  I have considered obtaining labs such as CBC, CMP, lipase but in the setting of hangover symptoms with a benign abdominal exam I felt advanced imaging and labs are not indicated at this time.  However if symptoms progress and worsen patient should return for further assessment. ? ? ? ? ? ? ? ?Final Clinical Impression(s) / ED Diagnoses ?Final diagnoses:  ?Hangover without complication ([place])  ? ? ?Rx / DC Orders ?ED Discharge Orders   ? ?      Ordered  ?  ondansetron (ZOFRAN) 4 MG tablet  Every 8 hours PRN       ? 06/10/21 1520  ? ?  ?  ? ?  ? ? ?  ?Domenic Moras, PA-C ?06/10/21 1521 ? ?  ?Valarie Merino, MD ?06/10/21 2253 ? ?

## 2021-06-10 NOTE — ED Provider Triage Note (Signed)
Emergency Medicine Provider Triage Evaluation Note ? ?Paige Schmidt , a 21 y.o. female  was evaluated in triage.  Pt complains of emesis. ? ?Review of Systems  ?Positive: Headache, nausea, vomiting ?Negative: Abd pain, diarrhea, constipation ? ?Physical Exam  ?BP 135/82 (BP Location: Right Arm)   Pulse 80   Temp 98.6 ?F (37 ?C) (Oral)   Resp 18   Ht 5\' 4"  (1.626 m)   Wt 49.9 kg   LMP 06/08/2021   SpO2 100%   BMI 18.88 kg/m?  ?Gen:   Awake, no distress   ?Resp:  Normal effort  ?MSK:   Moves extremities without difficulty  ?Other:   ? ?Medical Decision Making  ?Medically screening exam initiated at 2:04 PM.  Appropriate orders placed.  Paige Schmidt was informed that the remainder of the evaluation will be completed by another provider, this initial triage assessment does not replace that evaluation, and the importance of remaining in the ED until their evaluation is complete. ? ?Pt had consumed a moderate amount of alcohol the night before, now having hangover sxs.  No abd pain.  ?  ?06/10/2021, PA-C ?06/10/21 1408 ? ?

## 2022-03-29 DIAGNOSIS — Z8639 Personal history of other endocrine, nutritional and metabolic disease: Secondary | ICD-10-CM | POA: Diagnosis not present

## 2022-04-10 DIAGNOSIS — Z6820 Body mass index (BMI) 20.0-20.9, adult: Secondary | ICD-10-CM | POA: Diagnosis not present

## 2022-04-10 DIAGNOSIS — Z77090 Contact with and (suspected) exposure to asbestos: Secondary | ICD-10-CM | POA: Diagnosis not present

## 2022-04-25 DIAGNOSIS — F411 Generalized anxiety disorder: Secondary | ICD-10-CM | POA: Diagnosis not present

## 2022-07-18 DIAGNOSIS — F411 Generalized anxiety disorder: Secondary | ICD-10-CM | POA: Diagnosis not present

## 2022-09-06 DIAGNOSIS — L905 Scar conditions and fibrosis of skin: Secondary | ICD-10-CM | POA: Diagnosis not present

## 2022-10-11 DIAGNOSIS — Z6820 Body mass index (BMI) 20.0-20.9, adult: Secondary | ICD-10-CM | POA: Diagnosis not present

## 2022-10-11 DIAGNOSIS — M26609 Unspecified temporomandibular joint disorder, unspecified side: Secondary | ICD-10-CM | POA: Diagnosis not present

## 2023-01-01 DIAGNOSIS — Z13220 Encounter for screening for lipoid disorders: Secondary | ICD-10-CM | POA: Diagnosis not present

## 2023-01-01 DIAGNOSIS — Z0000 Encounter for general adult medical examination without abnormal findings: Secondary | ICD-10-CM | POA: Diagnosis not present

## 2023-03-12 DIAGNOSIS — N9089 Other specified noninflammatory disorders of vulva and perineum: Secondary | ICD-10-CM | POA: Diagnosis not present

## 2023-03-12 DIAGNOSIS — Z01419 Encounter for gynecological examination (general) (routine) without abnormal findings: Secondary | ICD-10-CM | POA: Diagnosis not present

## 2023-03-30 DIAGNOSIS — L0390 Cellulitis, unspecified: Secondary | ICD-10-CM | POA: Diagnosis not present

## 2023-04-11 ENCOUNTER — Other Ambulatory Visit (HOSPITAL_COMMUNITY): Payer: Self-pay | Admitting: Family Medicine

## 2023-04-11 ENCOUNTER — Ambulatory Visit (HOSPITAL_COMMUNITY)
Admission: RE | Admit: 2023-04-11 | Discharge: 2023-04-11 | Disposition: A | Discharge status: Home / Self Care | Payer: 59 | Source: Ambulatory Visit | Attending: Vascular Surgery | Admitting: Vascular Surgery

## 2023-04-11 DIAGNOSIS — R2242 Localized swelling, mass and lump, left lower limb: Secondary | ICD-10-CM | POA: Insufficient documentation

## 2023-04-11 DIAGNOSIS — Z6821 Body mass index (BMI) 21.0-21.9, adult: Secondary | ICD-10-CM | POA: Diagnosis not present

## 2023-04-22 ENCOUNTER — Ambulatory Visit: Payer: 59 | Admitting: Cardiology

## 2023-06-04 DIAGNOSIS — Z3202 Encounter for pregnancy test, result negative: Secondary | ICD-10-CM | POA: Diagnosis not present

## 2023-06-04 DIAGNOSIS — N644 Mastodynia: Secondary | ICD-10-CM | POA: Diagnosis not present

## 2023-06-17 ENCOUNTER — Ambulatory Visit: Payer: 59 | Attending: Cardiovascular Disease | Admitting: Cardiovascular Disease

## 2023-06-17 ENCOUNTER — Encounter: Payer: Self-pay | Admitting: Cardiovascular Disease

## 2023-06-17 VITALS — BP 102/54 | HR 60 | Ht 64.0 in | Wt 128.2 lb

## 2023-06-17 DIAGNOSIS — R2242 Localized swelling, mass and lump, left lower limb: Secondary | ICD-10-CM

## 2023-06-17 NOTE — Patient Instructions (Signed)
 Medication Instructions:  Your physician recommends that you continue on your current medications as directed. Please refer to the Current Medication list given to you today.  *If you need a refill on your cardiac medications before your next appointment, please call your pharmacy*   Follow-Up: At [facility], you and your health needs are our priority.  As part of our continuing mission to provide you with exceptional heart care, we have created designated Provider Care Teams.  These Care Teams include your primary Cardiologist (physician) and Advanced Practice Providers (APPs -  Physician Assistants and Nurse Practitioners) who all work together to provide you with the care you need, when you need it.  We recommend signing up for the patient portal called "MyChart".  Sign up information is provided on this After Visit Summary.  MyChart is used to connect with patients for Virtual Visits (Telemedicine).  Patients are able to view lab/test results, encounter notes, upcoming appointments, etc.  Non-urgent messages can be sent to your provider as well.   To learn more about what you can do with MyChart, go to [email or web address].    Your next appointment:   We will see you on an as needed basis.  Provider:   Nanetta Batty, MD

## 2023-06-24 NOTE — Progress Notes (Signed)
 Erroneous encounter

## 2023-11-04 DIAGNOSIS — Z113 Encounter for screening for infections with a predominantly sexual mode of transmission: Secondary | ICD-10-CM | POA: Diagnosis not present

## 2023-11-04 DIAGNOSIS — Z3009 Encounter for other general counseling and advice on contraception: Secondary | ICD-10-CM | POA: Diagnosis not present

## 2024-01-02 DIAGNOSIS — Z0000 Encounter for general adult medical examination without abnormal findings: Secondary | ICD-10-CM | POA: Diagnosis not present

## 2024-01-16 DIAGNOSIS — R519 Headache, unspecified: Secondary | ICD-10-CM | POA: Diagnosis not present

## 2024-01-16 DIAGNOSIS — R509 Fever, unspecified: Secondary | ICD-10-CM | POA: Diagnosis not present

## 2024-01-21 DIAGNOSIS — L700 Acne vulgaris: Secondary | ICD-10-CM | POA: Diagnosis not present

## 2024-05-21 ENCOUNTER — Encounter: Payer: Self-pay | Admitting: Obstetrics and Gynecology
